# Patient Record
Sex: Female | Born: 2000 | Race: White | Hispanic: No | Marital: Single | Smoking: Never smoker
Health system: Southern US, Community
[De-identification: ages and names within clinical notes are randomized; demographics above are authoritative.]

---
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
## 2020-07-21 ENCOUNTER — Ambulatory Visit (INDEPENDENT_AMBULATORY_CARE_PROVIDER_SITE_OTHER): Payer: Managed Care, Other (non HMO) | Admitting: Family Medicine

## 2020-07-21 ENCOUNTER — Encounter: Payer: Self-pay | Admitting: Family Medicine

## 2020-07-21 ENCOUNTER — Other Ambulatory Visit: Payer: Self-pay

## 2020-07-21 VITALS — BP 107/67 | HR 67 | Temp 98.3°F | Ht 68.0 in | Wt 142.2 lb

## 2020-07-21 DIAGNOSIS — Z0000 Encounter for general adult medical examination without abnormal findings: Secondary | ICD-10-CM | POA: Diagnosis not present

## 2020-07-21 DIAGNOSIS — Z0001 Encounter for general adult medical examination with abnormal findings: Secondary | ICD-10-CM

## 2020-07-21 NOTE — Patient Instructions (Signed)
It was very nice to see you today!  You are due for your meningitis vaccines, HPV vaccine, and hepatitis A vaccine.  We can do this here or you can get this done at the health department or even at student health at your [place].  Please continue working on diet and exercise.  I will see back in year for your next physical.  Come back to see me sooner if needed.  Take care, Dr Jerline Pain  PLEASE NOTE:  If you had any lab tests please let us know if you have not heard back within a few days. You may see your results on mychart before we have a chance to review them but we will give you a call once they are reviewed by [place]. If we ordered any referrals today, please let us know if you have not heard from their office within the next week.   Please try these tips to maintain a healthy lifestyle:  Eat at least 3 REAL meals and 1-2 snacks per day.  Aim for no more than 5 hours between eating.  If you eat breakfast, please do so within one hour of getting up.   Each meal should contain half fruits/vegetables, one quarter protein, and one quarter carbs (no bigger than a computer mouse)  Cut down on sweet beverages. This includes juice, soda, and sweet tea.   Drink at least 1 glass of water with each meal and aim for at least 8 glasses per day  Exercise at least 150 minutes every week.    Preventive Care 36-55 Years Old, Female Preventive care refers to lifestyle choices and visits with your health care provider that can promote health and wellness. This includes: A yearly physical exam. This is also called an annual wellness visit. Regular dental and eye exams. Immunizations. Screening for certain conditions. Healthy lifestyle choices, such as: Eating a healthy diet. Getting regular exercise. Not using drugs or products that contain nicotine and tobacco. Limiting alcohol use. What can I expect for my preventive care visit? Physical exam Your health care provider may check your: Height  and weight. These may be used to calculate your BMI (body mass index). BMI is a measurement that tells if you are at a healthy weight. Heart rate and blood pressure. Body temperature. Skin for abnormal spots. Counseling Your health care provider may ask you questions about your: Past medical problems. Family's medical history. Alcohol, tobacco, and drug use. Emotional well-being. Home life and relationship well-being. Sexual activity. Diet, exercise, and sleep habits. Work and work Statistician. Access to firearms. Method of birth control. Menstrual cycle. Pregnancy history. What immunizations do I need?  Vaccines are usually given at various ages, according to a schedule. Your health care provider will recommend vaccines for you based on your age, medicalhistory, and lifestyle or other factors, such as travel or where you work. What tests do I need?  Blood tests Lipid and cholesterol levels. These may be checked every 5 years starting at age 54. Hepatitis C test. Hepatitis B test. Screening Diabetes screening. This is done by checking your blood sugar (glucose) after you have not eaten for a while (fasting). STD (sexually transmitted disease) testing, if you are at risk. BRCA-related cancer screening. This may be done if you have a family history of breast, ovarian, tubal, or peritoneal cancers. Pelvic exam and Pap test. This may be done every 3 years starting at age 35. Starting at age 70, this may be done every 5 years if you  have a Pap test in combination with an HPV test. Talk with your health care provider about your test results, treatment options,and if necessary, the need for more tests. Follow these instructions at home: Eating and drinking  Eat a healthy diet that includes fresh fruits and vegetables, whole grains, lean protein, and low-fat dairy products. Take vitamin and mineral supplements as recommended by your health care provider. Do not drink alcohol if: Your  health care provider tells you not to drink. You are pregnant, may be pregnant, or are planning to become pregnant. If you drink alcohol: Limit how much you have to 0-1 drink a day. Be aware of how much alcohol is in your drink. In the [place] one drink equals one 12 oz bottle of beer (355 mL), one 5 oz glass of wine (148 mL), or one 1 oz glass of hard liquor (44 mL).  Lifestyle Take daily care of your teeth and gums. Brush your teeth every morning and night with fluoride toothpaste. Floss one time each day. Stay active. Exercise for at least 30 minutes 5 or more days each week. Do not use any products that contain nicotine or tobacco, such as cigarettes, e-cigarettes, and chewing tobacco. If you need help quitting, ask your health care provider. Do not use drugs. If you are sexually active, practice safe sex. Use a condom or other form of protection to prevent STIs (sexually transmitted infections). If you do not wish to become pregnant, use a form of birth control. If you plan to become pregnant, see your health care provider for a prepregnancy visit. Find healthy ways to cope with stress, such as: Meditation, yoga, or listening to music. Journaling. Talking to a trusted person. Spending time with friends and family. Safety Always wear your seat belt while driving or riding in a vehicle. Do not drive: If you have been drinking alcohol. Do not ride with someone who has been drinking. When you are tired or distracted. While texting. Wear a helmet and other protective equipment during sports activities. If you have firearms in your house, make sure you follow all gun safety procedures. Seek help if you have been physically or sexually abused. What's next? Go to your health care provider once a year for an annual wellness visit. Ask your health care provider how often you should have your eyes and teeth checked. Stay up to date on all vaccines. This information is not intended to replace  advice given to you by your health care provider. Make sure you discuss any questions you have with your healthcare provider. Document Revised: 08/18/2019 Document Reviewed: 08/31/2017 Elsevier Patient Education  2022 [place] for Parents  HPV (human papillomavirus) is a common virus that spreads easily from person to person through skin-to-skin or sexual contact. There are many types of HPV viruses. They can cause warts in the genitals (genital or mucosal HPV), or on the hands or feet (cutaneous or nonmucosal HPV). Some genital HPV types are considered high-risk and may cause cancer. Your child can get a vaccination to help prevent certain HPV infections that can cause cancer as well as those types that cause genital and anal warts. The vaccine is safe and effective. It is recommended for boys and girls at about 34-10 years of age. Getting the vaccine at this age (before he or she is sexually active) gives your child the best protection from HPV infectionthrough adulthood. How can HPV affect my child? HPV infection can cause: Genital warts.  Mouth or throat cancer. Anal cancer. Cervical, vulvar, or vaginal cancer. Penile cancer. During pregnancy, HPV infection can be passed to the baby. This infection cancause warts to develop in a baby's throat and windpipe. What actions can I take to lower my child's risk for HPV? To lower your child's risk for genital HPV infection, have him or her get the HPV vaccine before becoming sexually active. The best time for vaccination is between ages 3 and 28, though it can be given to children as young as 65 years old. If your child gets the first dose before age 40, the vaccination can be given as 2 shots, 6-12 months apart. In some situations, 3 doses are needed. If your child starts the vaccine before age 39 but does not have a second dose within 6-12 months after the first dose, he or she will need 3 doses to complete the  vaccination. When your child has the first dose, it is important to make an appointment for the next shot and keep the appointment. Teens who are not vaccinated before age 46 will need 3 doses, within six months of the first dose. If your child has a weak immune system, he or she may need 3 doses. Young adults can also get the vaccination, even if they are already sexually active and even if they have already been infected with HPV. The vaccination can still help prevent the types of cancer-causing HPV that a person has notbeen infected with. What are the risks and benefits of the HPV vaccine? Benefits The main benefit of getting vaccinated is to prevent certain cancers, including: Cervical, vulvar, and vaginal cancer in females. Penile cancer in males. Oral and anal cancer in both males and females. The risk of these cancers is lower if your child gets vaccinated before he orshe becomes sexually active. The vaccine also prevents genital warts caused by HPV. Risks The risks, although low, include side effects or reactions to the vaccine. Very few reactions have been reported, but they can include: Soreness, redness, or swelling at the injection site. Dizziness or headache. Fever. Who should not get the HPV vaccine or should wait to get it? Some children should not get the HPV vaccine or should wait. Discuss the risks and benefits of the vaccine with your child's health care provider if your child: Has had a severe allergic reaction to other vaccinations. Is allergic to yeast. Has a fever. Has had a recent illness. Is pregnant or may be pregnant. Where to find more information Centers for Disease Control and Prevention: https://www.boyd-meyer.org/ American Academy of Pediatrics: healthychildren.org Summary HPV (human papillomavirus) is a common virus that spreads from person to person through skin-to-skin or sexual contact. It can spread during vaginal, anal, or oral sex. Your child can get a vaccination  to prevent HPV infection and cancer. It is best to get the vaccination before becoming sexually active. The HPV vaccine can protect your child from genital warts and certain types of cancer, including cancer of the cervix, throat, mouth, vulva, vagina, anus, and penis. The HPV vaccine is both safe and effective. The best time for boys and girls to get the vaccination is when they are between ages 30 and 44. This information is not intended to replace advice given to you by your health care provider. Make sure you discuss any questions you have with your healthcare provider. Document Revised: 08/27/2019 Document Reviewed: 08/06/2019 Elsevier Patient Education  2022 [employer].

## 2020-07-21 NOTE — Progress Notes (Signed)
Rachel Pennington is a 20 y.o. female who presents today for a annual exam. She is a new patient.   Assessment/Plan:  Healthy 20 year old woman doing well.  Preventative Healthcare Seeing gynecology.  Pap smear deferred today.  Discussed vaccines.  She is apparently due for HPV, hepatitis A, and both meningitis vaccines.  We will obtain records from her pediatrician to make sure they were up-to-date.    Subjective:  HPI:  Today, patient plans to travel to [place] within a year in Fall 2023 for school  - wants to study abroad due to the cost of tuition being around $200. She wants to make sure that her immunizations up-to-date for travel safety. 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 Prior patient of [facility] pediatrics but she has aged out.  Patient is aware that she has had one vaccination for hepatitis.She is up-to-date on her flu vaccinations. Her Meningitis vaccination is potentially overdue. It is planned to obtain records from [facility]. HPV vaccination was offered today- patient was hesitant at first due to fear of needles, but she does plan to have this done soon - she will be out of town by August 20, 2020 so she has to plan for it. TD was in 2013 and she will be due next year - she will plan to have this done as well.  It has been years since she has seen her gynecologist - they could have talked to her about a pap smear but she is not well aware. Patient is on birth control and she wants to continue this prescription.  Her face recently became inflamed - was not able to touch her face due to pain ad being sensitive. She relates this to potentially eating something that may have triggered this reaction. Patient did take medication for it and it is clearing up gradually.  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 Her diet is well  balanced.  ROS: Per HPI, otherwise a complete review of systems was negative.   PMH:  The following were reviewed and entered/updated in epic: History reviewed. No pertinent past medical history. There are no problems to display for this patient.  History reviewed. No pertinent surgical history.  History reviewed. No pertinent family history.  Medications- reviewed and updated Current Outpatient Medications  Medication Sig Dispense Refill   norgestimate-ethinyl estradiol (ORTHO-CYCLEN) 0.25-35 MG-MCG tablet Take 1 tablet by mouth daily.     No current facility-administered medications for this visit.    Allergies-reviewed and updated Allergies  Allergen Reactions   Penicillins Rash    Social History   Socioeconomic History   Marital status: Single    Spouse name: Not on file   Number of children: Not on file   Years of education: Not on file   Highest education level: Not on file  Occupational History   Not on file  Tobacco Use   Smoking status: Not on file   Smokeless tobacco: Not on file  Substance and Sexual Activity   Alcohol use: Not on file   Drug use: Not on file   Sexual activity: Not on file  Other Topics Concern   Not on file  Social History Narrative   Not on file   Social Determinants of Health   Financial Resource Strain: Not on file  Food Insecurity: Not on file  Transportation Needs: Not on file  Physical  Activity: Not on file  Stress: Not on file  Social Connections: Not on file         Objective:  Physical Exam: BP 107/67   Pulse 67   Temp 98.3 F (36.8 C) (Temporal)   Ht 5\' 8"  (1.727 m)   Wt 142 lb 3.2 oz (64.5 kg)   LMP 07/09/2020   SpO2 98%   BMI 21.62 kg/m   Gen: No acute distress, resting comfortably CV: Regular rate and rhythm with no murmurs appreciated Pulm: Normal work of breathing, clear to auscultation bilaterally with no crackles, wheezes, or rhonchi Neuro: Grossly normal, moves all extremities Psych: Normal  affect and thought content      I,Harris Phan,acting as a scribe for 09/09/2020, MD.,have documented all relevant documentation on the behalf of Jacquiline Doe, MD,as directed by  Jacquiline Doe, MD while in the presence of Jacquiline Doe, MD.  I, Jacquiline Doe, MD, have reviewed all documentation for this visit. The documentation on 07/21/20 for the exam, diagnosis, procedures, and orders are all accurate and complete.  07/23/20. Katina Degree, MD 07/21/2020 3:18 PM

## 2021-05-11 ENCOUNTER — Other Ambulatory Visit: Payer: Self-pay | Admitting: Family Medicine

## 2021-05-11 DIAGNOSIS — G43809 Other migraine, not intractable, without status migrainosus: Secondary | ICD-10-CM

## 2022-01-18 ENCOUNTER — Ambulatory Visit (INDEPENDENT_AMBULATORY_CARE_PROVIDER_SITE_OTHER): Payer: Managed Care, Other (non HMO) | Admitting: Family Medicine

## 2022-01-18 ENCOUNTER — Encounter: Payer: Self-pay | Admitting: Family Medicine

## 2022-01-18 VITALS — BP 90/63 | HR 79 | Temp 97.8°F | Ht 68.0 in | Wt 144.6 lb

## 2022-01-18 DIAGNOSIS — Z114 Encounter for screening for human immunodeficiency virus [HIV]: Secondary | ICD-10-CM | POA: Diagnosis not present

## 2022-01-18 DIAGNOSIS — Z880 Allergy status to penicillin: Secondary | ICD-10-CM | POA: Diagnosis not present

## 2022-01-18 DIAGNOSIS — Z0001 Encounter for general adult medical examination with abnormal findings: Secondary | ICD-10-CM | POA: Diagnosis not present

## 2022-01-18 DIAGNOSIS — Z1159 Encounter for screening for other viral diseases: Secondary | ICD-10-CM | POA: Diagnosis not present

## 2022-01-18 DIAGNOSIS — Z23 Encounter for immunization: Secondary | ICD-10-CM | POA: Diagnosis not present

## 2022-01-18 DIAGNOSIS — Z13220 Encounter for screening for lipoid disorders: Secondary | ICD-10-CM | POA: Diagnosis not present

## 2022-01-18 LAB — COMPREHENSIVE METABOLIC PANEL
ALT: 20 U/L (ref 0–35)
AST: 18 U/L (ref 0–37)
Albumin: 4.5 g/dL (ref 3.5–5.2)
Alkaline Phosphatase: 57 U/L (ref 39–117)
BUN: 14 mg/dL (ref 6–23)
CO2: 28 mEq/L (ref 19–32)
Calcium: 9.5 mg/dL (ref 8.4–10.5)
Chloride: 103 mEq/L (ref 96–112)
Creatinine, Ser: 0.65 mg/dL (ref 0.40–1.20)
GFR: 124.44 mL/min (ref >60.00)
Glucose, Bld: 94 mg/dL (ref 70–99)
Potassium: 3.7 mEq/L (ref 3.5–5.1)
Sodium: 138 mEq/L (ref 135–145)
Total Bilirubin: 0.3 mg/dL (ref 0.2–1.2)
Total Protein: 7.1 g/dL (ref 6.0–8.3)

## 2022-01-18 LAB — TSH: TSH: 1.64 u[IU]/mL (ref 0.35–5.50)

## 2022-01-18 LAB — LIPID PANEL
Cholesterol: 194 mg/dL (ref 0–200)
HDL: 58.7 mg/dL (ref >39.00)
LDL Cholesterol: 108 mg/dL — ABNORMAL HIGH (ref 0–99)
NonHDL: 135.26
Total CHOL/HDL Ratio: 3
Triglycerides: 138 mg/dL (ref 0.0–149.0)
VLDL: 27.6 mg/dL (ref 0.0–40.0)

## 2022-01-18 LAB — CBC
HCT: 39.9 % (ref 36.0–46.0)
Hemoglobin: 13.6 g/dL (ref 12.0–15.0)
MCHC: 34 g/dL (ref 30.0–36.0)
MCV: 86.8 fl (ref 78.0–100.0)
Platelets: 201 10*3/uL (ref 150.0–400.0)
RBC: 4.6 Mil/uL (ref 3.87–5.11)
RDW: 13.6 % (ref 11.5–15.5)
WBC: 5 10*3/uL (ref 4.0–10.5)

## 2022-01-18 NOTE — Patient Instructions (Signed)
It was very nice to see you today!  Will check labs today.  I will refer you for allergy testing.  Will get your second HPV vaccine and first meningitis B vaccine.  We will see you back in a year for your next physical.  Come back sooner if needed.  Take care, Dr Jerline Pain  PLEASE NOTE:  If you had any lab tests, please let us know if you have not heard back within a few days. You may see your results on mychart before we have a chance to review them but we will give you a call once they are reviewed by [place].   If we ordered any referrals today, please let us know if you have not heard from their office within the next week.   If you had any urgent prescriptions sent in today, please check with the pharmacy within an hour of our visit to make sure the prescription was transmitted appropriately.   Please try these tips to maintain a healthy lifestyle:  Eat at least 3 REAL meals and 1-2 snacks per day.  Aim for no more than 5 hours between eating.  If you eat breakfast, please do so within one hour of getting up.   Each meal should contain half fruits/vegetables, one quarter protein, and one quarter carbs (no bigger than a computer mouse)  Cut down on sweet beverages. This includes juice, soda, and sweet tea.   Drink at least 1 glass of water with each meal and aim for at least 8 glasses per day  Exercise at least 150 minutes every week.    Preventive Care 86-71 Years Old, Female Preventive care refers to lifestyle choices and visits with your health care provider that can promote health and wellness. Preventive care visits are also called wellness exams. What can I expect for my preventive care visit? Counseling During your preventive care visit, your health care provider may ask about your: Medical history, including: Past medical problems. Family medical history. Pregnancy history. Current health, including: Menstrual cycle. Method of birth control. Emotional  well-being. Home life and relationship well-being. Sexual activity and sexual health. Lifestyle, including: Alcohol, nicotine or tobacco, and drug use. Access to firearms. Diet, exercise, and sleep habits. Work and work Statistician. Sunscreen use. Safety issues such as seatbelt and bike helmet use. Physical exam Your health care provider may check your: Height and weight. These may be used to calculate your BMI (body mass index). BMI is a measurement that tells if you are at a healthy weight. Waist circumference. This measures the distance around your waistline. This measurement also tells if you are at a healthy weight and may help predict your risk of certain diseases, such as type 2 diabetes and high blood pressure. Heart rate and blood pressure. Body temperature. Skin for abnormal spots. What immunizations do I need?  Vaccines are usually given at various ages, according to a schedule. Your health care provider will recommend vaccines for you based on your age, medical history, and lifestyle or other factors, such as travel or where you work. What tests do I need? Screening Your health care provider may recommend screening tests for certain conditions. This may include: Pelvic exam and Pap test. Lipid and cholesterol levels. Diabetes screening. This is done by checking your blood sugar (glucose) after you have not eaten for a while (fasting). Hepatitis B test. Hepatitis C test. HIV (human immunodeficiency virus) test. STI (sexually transmitted infection) testing, if you are at risk. BRCA-related cancer screening. This  may be done if you have a family history of breast, ovarian, tubal, or peritoneal cancers. Talk with your health care provider about your test results, treatment options, and if necessary, the need for more tests. Follow these instructions at home: Eating and drinking  Eat a healthy diet that includes fresh fruits and vegetables, whole grains, lean protein, and  low-fat dairy products. Take vitamin and mineral supplements as recommended by your health care provider. Do not drink alcohol if: Your health care provider tells you not to drink. You are pregnant, may be pregnant, or are planning to become pregnant. If you drink alcohol: Limit how much you have to 0-1 drink a day. Know how much alcohol is in your drink. In the [place] one drink equals one 12 oz bottle of beer (355 mL), one 5 oz glass of wine (148 mL), or one 1 oz glass of hard liquor (44 mL). Lifestyle Brush your teeth every morning and night with fluoride toothpaste. Floss one time each day. Exercise for at least 30 minutes 5 or more days each week. Do not use any products that contain nicotine or tobacco. These products include cigarettes, chewing tobacco, and vaping devices, such as e-cigarettes. If you need help quitting, ask your health care provider. Do not use drugs. If you are sexually active, practice safe sex. Use a condom or other form of protection to prevent STIs. If you do not wish to become pregnant, use a form of birth control. If you plan to become pregnant, see your health care provider for a prepregnancy visit. Find healthy ways to manage stress, such as: Meditation, yoga, or listening to music. Journaling. Talking to a trusted person. Spending time with friends and family. Minimize exposure to UV radiation to reduce your risk of skin cancer. Safety Always wear your seat belt while driving or riding in a vehicle. Do not drive: If you have been drinking alcohol. Do not ride with someone who has been drinking. If you have been using any mind-altering substances or drugs. While texting. When you are tired or distracted. Wear a helmet and other protective equipment during sports activities. If you have firearms in your house, make sure you follow all gun safety procedures. Seek help if you have been physically or sexually abused. What's next? Go to your health care  provider once a year for an annual wellness visit. Ask your health care provider how often you should have your eyes and teeth checked. Stay up to date on all vaccines. This information is not intended to replace advice given to you by your health care provider. Make sure you discuss any questions you have with your health care provider. Document Revised: 06/17/2020 Document Reviewed: 06/17/2020 Elsevier Patient Education  [place].

## 2022-01-18 NOTE — Progress Notes (Signed)
Chief Complaint:  Rachel Pennington is a 23 y.o. female who presents today for her annual comprehensive physical exam.    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 Will place referral today.   Preventative Healthcare: HpV and MenB given today. Check labs. Follows with GYN for women's health.   Patient Counseling(The following topics were reviewed and/or handout was given):  -Nutrition: Stressed importance of moderation in sodium/caffeine intake, saturated fat and cholesterol, caloric balance, sufficient intake of fresh fruits, vegetables, and fiber.  -Stressed the importance of regular exercise.   -Substance Abuse: Discussed cessation/primary prevention of tobacco, alcohol, or other drug use; driving or other dangerous activities under the influence; availability of treatment for abuse.   -Injury prevention: Discussed safety belts, safety helmets, smoke detector, smoking near bedding or upholstery.   -Sexuality: Discussed sexually transmitted diseases, partner selection, use of condoms, avoidance of unintended pregnancy and contraceptive alternatives.   -Dental health: Discussed importance of regular tooth brushing, flossing, and dental visits.  -Health maintenance and immunizations reviewed. Please refer to Health maintenance section.  Return to care in 1 year for next preventative visit.     Subjective:  HPI:  She has no acute complaints today.   Lifestyle Diet: Balanced. Plenty of fruits and vegetables.  Exercise: None specific. Does a lot of walking.      01/18/2022    1:07 PM  Depression screen PHQ 2/9  Decreased Interest 0  Down, Depressed, Hopeless 0  PHQ - 2 Score 0    Health Maintenance Due  Topic Date Due   Hepatitis C Screening  Never done   PAP-Cervical Cytology Screening  Never done   PAP SMEAR-Modifier  Never done   HPV VACCINES (2 - 3-dose series) 05/03/2021     ROS: Per HPI, otherwise a complete review of systems  was negative.   PMH:  The following were reviewed and entered/updated in epic: History reviewed. No pertinent past medical history. There are no problems to display for this patient.  History reviewed. No pertinent surgical history.  History reviewed. No pertinent family history.  Medications- reviewed and updated Current Outpatient Medications  Medication Sig Dispense Refill   norgestimate-ethinyl estradiol (ORTHO-CYCLEN) 0.25-35 MG-MCG tablet Take 1 tablet by mouth daily.     No current facility-administered medications for this visit.    Allergies-reviewed and updated Allergies  Allergen Reactions   Penicillins Rash    Social History   Socioeconomic History   Marital status: Single    Spouse name: Not on file   Number of children: Not on file   Years of education: Not on file   Highest education level: Not on file  Occupational History   Not on file  Tobacco Use   Smoking status: Not on file   Smokeless tobacco: Not on file  Substance and Sexual Activity   Alcohol use: Not on file   Drug use: Not on file   Sexual activity: Not on file  Other Topics Concern   Not on file  Social History Narrative   Not on file   Social Determinants of Health   Financial Resource Strain: Not on file  Food Insecurity: Not on file  Transportation Needs: Not on file  Physical Activity: Not on file  Stress: Not on file  Social Connections: Not on file        Objective:  Physical Exam: BP 90/63   Pulse 79   Temp 97.8 F (36.6 C) (Temporal)   Ht 5\' 8"  (1.727 m)  Wt 144 lb 9.6 oz (65.6 kg)   LMP 12/28/2021   SpO2 96%   BMI 21.99 kg/m   Body mass index is 21.99 kg/m. Wt Readings from Last 3 Encounters:  01/18/22 144 lb 9.6 oz (65.6 kg)  07/21/20 142 lb 3.2 oz (64.5 kg)   Gen: NAD, resting comfortably=HEENT: TMs normal bilaterally. OP clear. No thyromegaly noted.  CV: RRR with no murmurs appreciated Pulm: NWOB, CTAB with no crackles, wheezes, or rhonchi GI:  Normal bowel sounds present. Soft, Nontender, Nondistended. MSK: no edema, cyanosis, or clubbing noted Skin: warm, dry Neuro: CN2-12 grossly intact. Strength 5/5 in upper and lower extremities. Reflexes symmetric and intact bilaterally.  Psych: Normal affect and thought content     Rachel Pennington M. Jerline Pain, MD 01/18/2022 1:36 PM

## 2022-01-18 NOTE — Addendum Note (Signed)
Addended by: Betti Cruz on: 01/18/2022 03:03 PM   Modules accepted: Orders

## 2022-01-19 LAB — HIV ANTIBODY (ROUTINE TESTING W REFLEX): HIV 1&2 Ab, 4th Generation: NONREACTIVE

## 2022-01-19 LAB — HEPATITIS C ANTIBODY: Hepatitis C Ab: NONREACTIVE

## 2022-01-20 NOTE — Progress Notes (Signed)
Please inform patient of the following:  Her "bad" cholesterol is a little bit elevated but everything else is stable.  She should continue to work on diet and exercise and we can recheck in a few years.  Do not need to make any other changes to treatment plan at this time.

## 2022-02-25 ENCOUNTER — Other Ambulatory Visit: Payer: Self-pay | Admitting: *Deleted

## 2022-05-24 ENCOUNTER — Ambulatory Visit (INDEPENDENT_AMBULATORY_CARE_PROVIDER_SITE_OTHER): Payer: Managed Care, Other (non HMO)

## 2022-05-24 DIAGNOSIS — Z23 Encounter for immunization: Secondary | ICD-10-CM | POA: Diagnosis not present

## 2022-05-24 NOTE — Progress Notes (Signed)
Pt is here for HPV and Bexsero vaccines for Dr.Parker. HPV given in right deltoid and Bexsero given in left deltoid. Pt tolerated well.

## 2022-06-07 ENCOUNTER — Encounter: Payer: Self-pay | Admitting: Family Medicine

## 2022-06-07 ENCOUNTER — Ambulatory Visit (INDEPENDENT_AMBULATORY_CARE_PROVIDER_SITE_OTHER): Payer: Managed Care, Other (non HMO) | Admitting: Family Medicine

## 2022-06-07 VITALS — BP 114/56 | HR 77 | Temp 97.5°F | Ht 68.0 in | Wt 150.0 lb

## 2022-06-07 DIAGNOSIS — G43809 Other migraine, not intractable, without status migrainosus: Secondary | ICD-10-CM

## 2022-06-07 DIAGNOSIS — G43909 Migraine, unspecified, not intractable, without status migrainosus: Secondary | ICD-10-CM | POA: Insufficient documentation

## 2022-06-07 MED ORDER — RIZATRIPTAN BENZOATE 10 MG PO TABS: 10.0000 mg | ORAL_TABLET | ORAL | 3 refills | Status: AC | PRN

## 2022-06-07 NOTE — Assessment & Plan Note (Addendum)
No red flags.  Reassuring exam.  Her migraines have increased slightly in frequency and severity over the last year or 2.  This may be natural progression of her illness however patient and her mother are worried about other possible underlying etiologies.  Will check MRI to further evaluate independently rule out any other secondary causes.  We will refill Maxalt 10 mg daily as needed.  She has been tolerating this well.  Advised her to use this and other analgesics only once or twice per week as needed to prevent rebound headaches.  We also did discuss preventative medications.  She would like to hold off on this for now and she will let us know if her migraines become more frequent or severe and we will readdress.  She does not want to try any injections.  Will prefer oral medication if possible.

## 2022-06-07 NOTE — Progress Notes (Signed)
   Rachel Pennington is a 22 y.o. female who presents today for an office visit.  Assessment/Plan:  Chronic Problems Addressed Today: Migraine No red flags.  Reassuring exam.  Her migraines have increased slightly in frequency and severity over the last year or 2.  This may be natural progression of her illness however patient and her mother are worried about other possible underlying etiologies.  Will check MRI to further evaluate independently rule out any other secondary causes.  We will refill Maxalt 10 mg daily as needed.  She has been tolerating this well.  Advised her to use this and other analgesics only once or twice per week as needed to prevent rebound headaches.  We also did discuss preventative medications.  She would like to hold off on this for now and she will let us know if her migraines become more frequent or severe and we will readdress.  She does not want to try any injections.  Will prefer oral medication if possible.     Subjective:  HPI:  Patient here with headaches and migraines. This has been going since she was a child. She is getting headaches about 1-2 times per week. Lasts for hours until she takes medication. Sometimes goes away after going to sleep. She is normally taking tylenol or motrin which has previously helped but this has seemed to lose effectiveness.  She has been diagnosed with migraines in the past by previous physicians.  In the past has been prescribed Maxalt which does work well.  Last prescription was a year or 2 ago.  She does notice several triggers including strong emotions, dehydration, sleep deprivation, strong smells, loud noises, and lites.  She does try to avoid triggers.  No reported weakness or numbness.       Objective:  Physical Exam: BP (!) 114/56   Pulse 77   Temp (!) 97.5 F (36.4 C) (Temporal)   Ht 5\' 8"  (1.727 m)   Wt 150 lb (68 kg)   LMP 05/04/2022 (Exact Date)   SpO2 96%   BMI 22.81 kg/m   Gen: No acute distress,  resting comfortably Neuro: Cranial nerves II through XII intact.  Moves all extremities spontaneously.  No focal deficits. Psych: Normal affect and thought content      Vinisha Faxon M. Jimmey Ralph, MD 06/07/2022 2:31 PM

## 2022-06-07 NOTE — Patient Instructions (Signed)
It was very nice to see you today!  We will check an MRI.  I will refill the rizatriptan.  Please let us know if you have worsening or more frequent migraines and we can discuss starting a preventative medication.  Return if symptoms worsen or fail to improve.   Take care, Dr Jimmey Ralph  PLEASE NOTE:  If you had any lab tests, please let us know if you have not heard back within a few days. You may see your results on mychart before we have a chance to review them but we will give you a call once they are reviewed by [place].   If we ordered any referrals today, please let us know if you have not heard from their office within the next week.   If you had any urgent prescriptions sent in today, please check with the pharmacy within an hour of our visit to make sure the prescription was transmitted appropriately.   Please try these tips to maintain a healthy lifestyle:  Eat at least 3 REAL meals and 1-2 snacks per day.  Aim for no more than 5 hours between eating.  If you eat breakfast, please do so within one hour of getting up.   Each meal should contain half fruits/vegetables, one quarter protein, and one quarter carbs (no bigger than a computer mouse)  Cut down on sweet beverages. This includes juice, soda, and sweet tea.   Drink at least 1 glass of water with each meal and aim for at least 8 glasses per day  Exercise at least 150 minutes every week.

## 2022-06-10 ENCOUNTER — Encounter: Payer: Self-pay | Admitting: Family Medicine

## 2022-06-17 ENCOUNTER — Ambulatory Visit
Admission: RE | Admit: 2022-06-17 | Discharge: 2022-06-17 | Disposition: A | Discharge status: Home / Self Care | Payer: Managed Care, Other (non HMO) | Source: Ambulatory Visit | Attending: Family Medicine | Admitting: Family Medicine

## 2022-06-17 DIAGNOSIS — G43809 Other migraine, not intractable, without status migrainosus: Secondary | ICD-10-CM

## 2022-06-19 ENCOUNTER — Other Ambulatory Visit: Payer: Managed Care, Other (non HMO)

## 2022-06-22 ENCOUNTER — Telehealth: Payer: Self-pay | Admitting: Family Medicine

## 2022-06-22 ENCOUNTER — Other Ambulatory Visit: Payer: Self-pay | Admitting: *Deleted

## 2022-06-22 DIAGNOSIS — G43809 Other migraine, not intractable, without status migrainosus: Secondary | ICD-10-CM

## 2022-06-22 NOTE — Telephone Encounter (Signed)
French Ana with [facility] requests to be called re: Patient's MRI Head results

## 2022-06-22 NOTE — Telephone Encounter (Signed)
See result note.  

## 2022-06-22 NOTE — Progress Notes (Signed)
Her MRI showed a few non specific abnormalities.  It is possible that the findings could be caused by her migraines however I recommend we have her see a neurologist for further evaluation.  Please place referral.

## 2022-06-30 ENCOUNTER — Encounter: Payer: Self-pay | Admitting: Neurology

## 2022-07-04 NOTE — Progress Notes (Unsigned)
New Patient Note  RE: Rachel Pennington MRN: [id number] DOB: [birth year] Date of Office Visit: 07/05/2022  Consult requested by: Ardith Dark, MD Primary care provider: Ardith Dark, MD  Chief Complaint: No chief complaint on file.  History of Present Illness: I had the pleasure of seeing Rachel Pennington for initial evaluation at the [facility] on 07/04/2022. She is a 22 y.o. female, who is referred here by Ardith Dark, MD for the evaluation of ***.  ***  Assessment and Plan: Rachel Pennington is a 22 y.o. female with: No problem-specific Assessment & Plan notes found for this encounter.  No follow-ups on file.  No orders of the defined types were placed in this encounter.  Lab Orders  No laboratory test(s) ordered today    Other allergy screening: Asthma: {Blank single:19197::"yes","no"} Rhino conjunctivitis: {Blank single:19197::"yes","no"} Food allergy: {Blank single:19197::"yes","no"} Medication allergy: {Blank single:19197::"yes","no"} Hymenoptera allergy: {Blank single:19197::"yes","no"} Urticaria: {Blank single:19197::"yes","no"} Eczema:{Blank single:19197::"yes","no"} History of recurrent infections suggestive of immunodeficency: {Blank single:19197::"yes","no"}  Diagnostics: Spirometry:  Tracings reviewed. Her effort: {Blank single:19197::"Good reproducible efforts.","It was hard to get consistent efforts and there is a question as to whether this reflects a maximal maneuver.","Poor effort, data can not be interpreted."} FVC: ***L FEV1: ***L, ***% predicted FEV1/FVC ratio: ***% Interpretation: {Blank single:19197::"Spirometry consistent with mild obstructive disease","Spirometry consistent with moderate obstructive disease","Spirometry consistent with severe obstructive disease","Spirometry consistent with possible restrictive disease","Spirometry consistent with mixed obstructive and restrictive disease","Spirometry uninterpretable due  to technique","Spirometry consistent with normal pattern","No overt abnormalities noted given today's efforts"}.  Please see scanned spirometry results for details.  Skin Testing: {Blank single:19197::"Select foods","Environmental allergy panel","Environmental allergy panel and select foods","Food allergy panel","None","Deferred due to recent antihistamines use"}. *** Results discussed with patient/family.   Past Medical History: Patient Active Problem List   Diagnosis Date Noted  . Migraine 06/07/2022   No past medical history on file. Past Surgical History: No past surgical history on file. Medication List:  Current Outpatient Medications  Medication Sig Dispense Refill  . norgestimate-ethinyl estradiol (ORTHO-CYCLEN) 0.25-35 MG-MCG tablet Take 1 tablet by mouth daily.    . rizatriptan (MAXALT) 10 MG tablet Take 1 tablet (10 mg total) by mouth as needed for migraine. May repeat in 2 hours if needed 30 tablet 3   No current facility-administered medications for this visit.   Allergies: Allergies  Allergen Reactions  . Penicillins Rash   Social History: Social History   Socioeconomic History  . Marital status: Single    Spouse name: Not on file  . Number of children: Not on file  . Years of education: Not on file  . Highest education level: Not on file  Occupational History  . Not on file  Tobacco Use  . Smoking status: Never    Passive exposure: Never  . Smokeless tobacco: Never  Substance and Sexual Activity  . Alcohol use: Yes    Comment: occ  . Drug use: Never  . Sexual activity: Not on file  Other Topics Concern  . Not on file  Social History Narrative  . Not on file   Social Determinants of Health   Financial Resource Strain: Not on file  Food Insecurity: Not on file  Transportation Needs: Not on file  Physical Activity: Not on file  Stress: Not on file  Social Connections: Not on file   Lives in a ***. Smoking: *** Occupation:  ***  Environmental History: Water Damage/mildew in the house: Copywriter, advertising in the family room: {Blank single:19197::"yes","no"} [employer]  in the bedroom: {Blank single:19197::"yes","no"} Heating: {Blank single:19197::"electric","gas","heat pump"} Cooling: {Blank single:19197::"central","window","heat pump"} Pet: {Blank single:19197::"yes ***","no"}  Family History: No family history on file. Problem                               Relation Asthma                                   *** Eczema                                *** Food allergy                          *** Allergic rhino conjunctivitis     ***  Review of Systems  Constitutional:  Negative for appetite change, chills, fever and unexpected weight change.  HENT:  Negative for congestion and rhinorrhea.   Eyes:  Negative for itching.  Respiratory:  Negative for cough, chest tightness, shortness of breath and wheezing.   Cardiovascular:  Negative for chest pain.  Gastrointestinal:  Negative for abdominal pain.  Genitourinary:  Negative for difficulty urinating.  Skin:  Negative for rash.  Neurological:  Negative for headaches.   Objective: There were no vitals taken for this visit. There is no height or weight on file to calculate BMI. Physical Exam Vitals and nursing note reviewed.  Constitutional:      Appearance: Normal appearance. She is well-developed.  HENT:     Head: Normocephalic and atraumatic.     Right Ear: Tympanic membrane and external ear normal.     Left Ear: Tympanic membrane and external ear normal.     Nose: Nose normal.     Mouth/Throat:     Mouth: Mucous membranes are moist.     Pharynx: Oropharynx is clear.  Eyes:     Conjunctiva/sclera: Conjunctivae normal.  Cardiovascular:     Rate and Rhythm: Normal rate and regular rhythm.     Heart sounds: Normal heart sounds. No murmur heard.    No friction rub. No gallop.  Pulmonary:     Effort: Pulmonary effort is normal.      Breath sounds: Normal breath sounds. No wheezing, rhonchi or rales.  Musculoskeletal:     Cervical back: Neck supple.  Skin:    General: Skin is warm.     Findings: No rash.  Neurological:     Mental Status: She is alert and oriented to person, place, and time.  Psychiatric:        Behavior: Behavior normal.  The plan was reviewed with the patient/family, and all questions/concerned were addressed.  It was my pleasure to see Rachel Pennington today and participate in her care. Please feel free to contact me with any questions or concerns.  Sincerely,  Wyline Mood, DO Allergy & Immunology  [facility] office: [phone] [facility] office: [phone]

## 2022-07-05 ENCOUNTER — Ambulatory Visit (INDEPENDENT_AMBULATORY_CARE_PROVIDER_SITE_OTHER): Payer: Managed Care, Other (non HMO) | Admitting: Allergy

## 2022-07-05 ENCOUNTER — Encounter: Payer: Self-pay | Admitting: Allergy

## 2022-07-05 VITALS — BP 110/80 | HR 73 | Temp 98.5°F | Resp 16 | Ht 68.0 in | Wt 150.5 lb

## 2022-07-05 DIAGNOSIS — T781XXD Other adverse food reactions, not elsewhere classified, subsequent encounter: Secondary | ICD-10-CM

## 2022-07-05 DIAGNOSIS — Z880 Allergy status to penicillin: Secondary | ICD-10-CM | POA: Diagnosis not present

## 2022-07-05 DIAGNOSIS — T781XXA Other adverse food reactions, not elsewhere classified, initial encounter: Secondary | ICD-10-CM | POA: Diagnosis not present

## 2022-07-05 DIAGNOSIS — R21 Rash and other nonspecific skin eruption: Secondary | ICD-10-CM

## 2022-07-05 NOTE — Patient Instructions (Addendum)
Today's skin testing was negative to avocado.  Foods Discussed with patient that skin prick testing and bloodwork (food IgE levels) check for IgE mediated reactions which her clinical presentation does not support.  Continue to avoid avocado - you most likely have some intolerance to it.   Penicillin allergy: Consider penicillin allergy skin testing and in office drug challenge in the future.  Over 90% of people with history of penicillin allergy which occurred over 10 years ago are found to be non-allergic.  You must be off antihistamines for 3-5 days before. Plan on being in the office for 2-3 hours. You must call to schedule an appointment and specify it's for a drug challenge.  A few days prior to the appointment, I will send in a prescription for amoxicillin liquid which you must bring to the appointment as well.   Skin See below for proper skin care. Follow up with dermatologist as scheduled.  Follow up for penicillin drug testing and challenge.   Skin care recommendations  Bath time: Always use lukewarm water. AVOID very hot or cold water. Keep bathing time to 5-10 minutes. Do NOT use bubble bath. Use a mild soap and use just enough to wash the dirty areas. Do NOT scrub skin vigorously.  After bathing, pat dry your skin with a towel. Do NOT rub or scrub the skin.  Moisturizers and prescriptions:  ALWAYS apply moisturizers immediately after bathing (within 3 minutes). This helps to lock-in moisture. Use the moisturizer several times a day over the whole body. Good summer moisturizers include: Aveeno, CeraVe, Cetaphil. Good winter moisturizers include: Aquaphor, Vaseline, Cerave, Cetaphil, Eucerin, Vanicream. When using moisturizers along with medications, the moisturizer should be applied about one hour after applying the medication to prevent diluting effect of the medication or moisturize around where you applied the medications. When not using medications, the moisturizer  can be continued twice daily as maintenance.  Laundry and clothing: Avoid laundry products with added color or perfumes. Use unscented hypo-allergenic laundry products such as Tide free, Cheer free & gentle, and All free and clear.  If the skin still seems dry or sensitive, you can try double-rinsing the clothes. Avoid tight or scratchy clothing such as wool. Do not use fabric softeners or dyer sheets.

## 2022-07-06 ENCOUNTER — Encounter: Payer: Self-pay | Admitting: Allergy

## 2022-07-06 DIAGNOSIS — Z713 Dietary counseling and surveillance: Secondary | ICD-10-CM | POA: Insufficient documentation

## 2022-07-06 DIAGNOSIS — Z880 Allergy status to penicillin: Secondary | ICD-10-CM | POA: Insufficient documentation

## 2022-07-06 DIAGNOSIS — T781XXD Other adverse food reactions, not elsewhere classified, subsequent encounter: Secondary | ICD-10-CM | POA: Insufficient documentation

## 2022-07-06 DIAGNOSIS — R21 Rash and other nonspecific skin eruption: Secondary | ICD-10-CM | POA: Insufficient documentation

## 2022-07-06 NOTE — Assessment & Plan Note (Signed)
See below for proper skin care. Follow up with dermatologist as scheduled.

## 2022-07-06 NOTE — Assessment & Plan Note (Signed)
Rash and itching as a child. Unsure of treatment. Tolerates other antibiotics with no issues. Consider penicillin allergy skin testing and in office drug challenge in the future.  Over 90% of people with history of penicillin allergy which occurred over 10 years ago are found to be non-allergic.

## 2022-07-06 NOTE — Assessment & Plan Note (Signed)
Abdominal pain with avocado ingestion that resolves after a few hours without intervention. No other symptoms. No issues with latex, kiwi or bananas. Tolerates a varied food otherwise. Today's skin testing was negative to avocado. Discussed with patient that skin prick testing and bloodwork (food IgE levels) check for IgE mediated reactions which her clinical presentation does not support.  Continue to avoid avocado - you most likely have some intolerance to it.

## 2022-07-27 ENCOUNTER — Telehealth: Payer: Self-pay | Admitting: Allergy

## 2022-07-27 MED ORDER — AMOXICILLIN 250 MG/5ML PO SUSR: ORAL | 0 refills | Status: DC

## 2022-07-27 NOTE — Telephone Encounter (Signed)
Please call patient and let her know that amoxicillin Rx was sent in.  Do NOT take at home and bring to next week's drug challenge.  Keep medication in fridge.  You must be off antihistamines for 3-5 days before. Must be in good health and not ill. No vaccines/injections/antibiotics within the past 7 days. Plan on being in the office for 2-3 hours and must bring in the drug you want to do the oral challenge for.

## 2022-07-27 NOTE — Telephone Encounter (Signed)
Spoke with patient, informed her of Dr. Elmyra Ricks note below. Patient verbalized understanding.

## 2022-08-03 NOTE — Progress Notes (Deleted)
Follow Up Note  RE: Allice Shiu MRN: [id number] DOB: 02/21/2000 Date of Office Visit: 08/04/2022  Referring provider: Ardith Dark, MD Primary care provider: Ardith Dark, MD  Chief Complaint: No chief complaint on file.  Assessment and Plan: Marlette is a 22 y.o. female with: No problem-specific Assessment & Plan notes found for this encounter.  No follow-ups on file.  Plan: Challenge drug: *** Challenge as per protocol: {Blank single:19197::"Passed","Failed"} Total time: ***  For next 24 hours monitor for hives, swelling, shortness of breath and dizziness. If you see these symptoms, use Benadryl for mild symptoms and for more severe symptoms go to ER/urgent care or call 911.  If no adverse symptoms in the next 24 hours then will take off drug from allergy list.  History of Present Illness: I had the pleasure of seeing Alescia Rowbotham for a follow up visit at the [facility] on 08/03/2022. She is a 22 y.o. female, who is being followed for adverse food reaction, penicillin allergy and rash. Her previous allergy office visit was on 07/05/2022 with Dr. Selena Batten. Today she is here for penicillin drug testing and challenge.   History of Reaction: Other adverse food reactions, not elsewhere classified, subsequent encounter Abdominal pain with avocado ingestion that resolves after a few hours without intervention. No other symptoms. No issues with latex, kiwi or bananas. Tolerates a varied food otherwise. Today's skin testing was negative to avocado. Discussed with patient that skin prick testing and bloodwork (food IgE levels) check for IgE mediated reactions which her clinical presentation does not support.  Continue to avoid avocado - you most likely have some intolerance to it.    Penicillin allergy Rash and itching as a child. Unsure of treatment. Tolerates other antibiotics with no issues. Consider penicillin allergy skin testing and in office  drug challenge in the future.  Over 90% of people with history of penicillin allergy which occurred over 10 years ago are found to be non-allergic.    Rash and other nonspecific skin eruption See below for proper skin care. Follow up with dermatologist as scheduled.  Interval History: Patient has not been ill, she has not had any accidental exposures to the culprit medication.   Recent/Current History: Pulmonary disease: {Blank single:19197::"yes","no"} Cardiac disease: {Blank single:19197::"yes","no"} Respiratory infection: {Blank single:19197::"yes","no"} Rash: {Blank single:19197::"yes","no"} Itch: {Blank single:19197::"yes","no"} Swelling: {Blank single:19197::"yes","no"} Cough: {Blank single:19197::"yes","no"} Shortness of breath: {Blank single:19197::"yes","no"} Runny/stuffy nose: {Blank single:19197::"yes","no"} Itchy eyes: {Blank single:19197::"yes","no"} Beta-blocker use: {Blank single:19197::"yes","no"}  Patient/guardian was informed of the test procedure with verbalized understanding of the risk of anaphylaxis. Consent was signed.   Last antihistamine use: *** Last beta-blocker use: ***  Medication List:  Current Outpatient Medications  Medication Sig Dispense Refill   Acetaminophen (TYLENOL PO) Take by mouth.     amoxicillin (AMOXIL) 250 MG/5ML suspension Do NOT take at home. Bring to Dr. Elmyra Ricks office for drug challenge. 80 mL 0   Ibuprofen (MOTRIN PO) Take by mouth.     norgestimate-ethinyl estradiol (ORTHO-CYCLEN) 0.25-35 MG-MCG tablet Take 1 tablet by mouth daily.     rizatriptan (MAXALT) 10 MG tablet Take 1 tablet (10 mg total) by mouth as needed for migraine. May repeat in 2 hours if needed 30 tablet 3   No current facility-administered medications for this visit.   Allergies: Allergies  Allergen Reactions   Penicillins Rash   I reviewed her past medical history, social history, family history, and environmental history and no significant changes have been  reported from  her previous visit.  Review of Systems  Constitutional:  Negative for appetite change, chills, fever and unexpected weight change.  HENT:  Negative for congestion and rhinorrhea.   Eyes:  Negative for itching.  Respiratory:  Negative for cough, chest tightness, shortness of breath and wheezing.   Cardiovascular:  Negative for chest pain.  Gastrointestinal:  Negative for abdominal pain.  Genitourinary:  Negative for difficulty urinating.  Skin:  Positive for rash.  Neurological:  Negative for headaches.   Objective: There were no vitals taken for this visit. There is no height or weight on file to calculate BMI. Physical Exam Vitals and nursing note reviewed.  Constitutional:      Appearance: Normal appearance. She is well-developed.  HENT:     Head: Normocephalic and atraumatic.     Right Ear: Tympanic membrane and external ear normal.     Left Ear: Tympanic membrane and external ear normal.     Nose: Nose normal.     Mouth/Throat:     Mouth: Mucous membranes are moist.     Pharynx: Oropharynx is clear.  Eyes:     Conjunctiva/sclera: Conjunctivae normal.  Cardiovascular:     Rate and Rhythm: Normal rate and regular rhythm.     Heart sounds: Normal heart sounds. No murmur heard.    No friction rub. No gallop.  Pulmonary:     Effort: Pulmonary effort is normal.     Breath sounds: Normal breath sounds. No wheezing, rhonchi or rales.  Musculoskeletal:     Cervical back: Neck supple.  Skin:    General: Skin is warm.     Findings: No rash.     Comments: Erythematous hue on the face.  Neurological:     Mental Status: She is alert and oriented to person, place, and time.  Psychiatric:        Behavior: Behavior normal.     Diagnostics: Spirometry:  Tracings reviewed. Her effort: {Blank single:19197::"Good reproducible efforts.","It was hard to get consistent efforts and there is a question as to whether this reflects a maximal maneuver.","Poor effort, data can  not be interpreted."} FVC: ***L FEV1: ***L, ***% predicted FEV1/FVC ratio: ***% Interpretation: {Blank single:19197::"Spirometry consistent with mild obstructive disease","Spirometry consistent with moderate obstructive disease","Spirometry consistent with severe obstructive disease","Spirometry consistent with possible restrictive disease","Spirometry consistent with mixed obstructive and restrictive disease","Spirometry uninterpretable due to technique","Spirometry consistent with normal pattern","No overt abnormalities noted given today's efforts"}.  Please see scanned spirometry results for details.  Skin Testing: {Blank single:19197::"None","Deferred due to recent antihistamines use"}. Positive test to: ***. Negative test to: ***.  Results discussed with patient/family.   Previous notes and tests were reviewed. The plan was reviewed with the patient/family, and all questions/concerned were addressed.  It was my pleasure to see Anny today and participate in her care. Please feel free to contact me with any questions or concerns.  Sincerely,  Wyline Mood, DO Allergy & Immunology  [facility] office: [phone] [facility] office: [phone]

## 2022-08-04 ENCOUNTER — Encounter: Payer: Managed Care, Other (non HMO) | Admitting: Allergy

## 2022-08-04 DIAGNOSIS — Z888 Allergy status to other drugs, medicaments and biological substances status: Secondary | ICD-10-CM

## 2022-08-04 DIAGNOSIS — Z880 Allergy status to penicillin: Secondary | ICD-10-CM

## 2022-08-15 NOTE — Progress Notes (Deleted)
Follow Up Note  RE: Rachel Pennington MRN: [id number] DOB: 08-31-2000 Date of Office Visit: 08/16/2022  Referring provider: Ardith Dark, MD Primary care provider: Ardith Dark, MD  Chief Complaint: No chief complaint on file.  Assessment and Plan: Rachel Pennington is a 22 y.o. female with: No problem-specific Assessment & Plan notes found for this encounter.  No follow-ups on file.  Plan: Challenge drug: *** Challenge as per protocol: {Blank single:19197::"Passed","Failed"} Total time: ***  For next 24 hours monitor for hives, swelling, shortness of breath and dizziness. If you see these symptoms, use Benadryl for mild symptoms and for more severe symptoms go to ER/urgent care or call 911.  If no adverse symptoms in the next 24 hours then will take off drug from allergy list.  History of Present Illness: I had the pleasure of seeing Rachel Pennington for a follow up visit at the [facility] on 08/15/2022. She is a 22 y.o. female, who is being followed for adverse food reaction, penicillin allergy, rash. Her previous allergy office visit was on 07/05/2022 with Dr. Selena Batten. Today she is here for penicillin drug testing and challenge.   History of Reaction: Other adverse food reactions, not elsewhere classified, subsequent encounter Abdominal pain with avocado ingestion that resolves after a few hours without intervention. No other symptoms. No issues with latex, kiwi or bananas. Tolerates a varied food otherwise. Today's skin testing was negative to avocado. Discussed with patient that skin prick testing and bloodwork (food IgE levels) check for IgE mediated reactions which her clinical presentation does not support.  Continue to avoid avocado - you most likely have some intolerance to it.    Penicillin allergy Rash and itching as a child. Unsure of treatment. Tolerates other antibiotics with no issues. Consider penicillin allergy skin testing and in office drug  challenge in the future.  Over 90% of people with history of penicillin allergy which occurred over 10 years ago are found to be non-allergic.    Rash and other nonspecific skin eruption See below for proper skin care. Follow up with dermatologist as scheduled.  Interval History: Patient has not been ill, she has not had any accidental exposures to the culprit medication.   Recent/Current History: Pulmonary disease: {Blank single:19197::"yes","no"} Cardiac disease: {Blank single:19197::"yes","no"} Respiratory infection: {Blank single:19197::"yes","no"} Rash: {Blank single:19197::"yes","no"} Itch: {Blank single:19197::"yes","no"} Swelling: {Blank single:19197::"yes","no"} Cough: {Blank single:19197::"yes","no"} Shortness of breath: {Blank single:19197::"yes","no"} Runny/stuffy nose: {Blank single:19197::"yes","no"} Itchy eyes: {Blank single:19197::"yes","no"} Beta-blocker use: {Blank single:19197::"yes","no"}  Patient/guardian was informed of the test procedure with verbalized understanding of the risk of anaphylaxis. Consent was signed.   Last antihistamine use: *** Last beta-blocker use: ***  Medication List:  Current Outpatient Medications  Medication Sig Dispense Refill   Acetaminophen (TYLENOL PO) Take by mouth.     amoxicillin (AMOXIL) 250 MG/5ML suspension Do NOT take at home. Bring to Dr. Elmyra Ricks office for drug challenge. 80 mL 0   Ibuprofen (MOTRIN PO) Take by mouth.     norgestimate-ethinyl estradiol (ORTHO-CYCLEN) 0.25-35 MG-MCG tablet Take 1 tablet by mouth daily.     rizatriptan (MAXALT) 10 MG tablet Take 1 tablet (10 mg total) by mouth as needed for migraine. May repeat in 2 hours if needed 30 tablet 3   No current facility-administered medications for this visit.   Allergies: Allergies  Allergen Reactions   Penicillins Rash   I reviewed her past medical history, social history, family history, and environmental history and no significant changes have been  reported from her  previous visit.  Review of Systems  Constitutional:  Negative for appetite change, chills, fever and unexpected weight change.  HENT:  Negative for congestion and rhinorrhea.   Eyes:  Negative for itching.  Respiratory:  Negative for cough, chest tightness, shortness of breath and wheezing.   Cardiovascular:  Negative for chest pain.  Gastrointestinal:  Negative for abdominal pain.  Genitourinary:  Negative for difficulty urinating.  Skin:  Positive for rash.  Neurological:  Negative for headaches.   Objective: There were no vitals taken for this visit. There is no height or weight on file to calculate BMI. Physical Exam Vitals and nursing note reviewed.  Constitutional:      Appearance: Normal appearance. She is well-developed.  HENT:     Head: Normocephalic and atraumatic.     Right Ear: Tympanic membrane and external ear normal.     Left Ear: Tympanic membrane and external ear normal.     Nose: Nose normal.     Mouth/Throat:     Mouth: Mucous membranes are moist.     Pharynx: Oropharynx is clear.  Eyes:     Conjunctiva/sclera: Conjunctivae normal.  Cardiovascular:     Rate and Rhythm: Normal rate and regular rhythm.     Heart sounds: Normal heart sounds. No murmur heard.    No friction rub. No gallop.  Pulmonary:     Effort: Pulmonary effort is normal.     Breath sounds: Normal breath sounds. No wheezing, rhonchi or rales.  Musculoskeletal:     Cervical back: Neck supple.  Skin:    General: Skin is warm.     Findings: No rash.     Comments: Erythematous hue on the face.  Neurological:     Mental Status: She is alert and oriented to person, place, and time.  Psychiatric:        Behavior: Behavior normal.     Diagnostics: Skin Testing: {Blank single:19197::"None","Deferred due to recent antihistamines use"}. Positive test to: ***. Negative test to: ***.  Results discussed with patient/family.   Previous notes and tests were reviewed. The  plan was reviewed with the patient/family, and all questions/concerned were addressed.  It was my pleasure to see Rachel Pennington today and participate in her care. Please feel free to contact me with any questions or concerns.  Sincerely,  Wyline Mood, DO Allergy & Immunology  [facility] office: [phone] [facility] office: [phone]

## 2022-08-16 ENCOUNTER — Encounter: Payer: Self-pay | Admitting: Allergy

## 2022-12-16 NOTE — Progress Notes (Unsigned)
NEUROLOGY CONSULTATION NOTE  Rachel Pennington MRN: [id number] DOB: Dec 16, 2000  Referring provider: Jacquiline Doe, MD Primary care provider: Jacquiline Doe, MD  Reason for consult:  migraine  Assessment/Plan:   ***   Subjective:  Rachel Pennington is a 22 year old ***-handed female who presents for migraines.  History supplemented by prior neurologist's and referring provider's notes.  MRI of brain personally reviewed.  Onset:  22 years old Location:  *** Quality:  *** Intensity:  ***.  *** denies new headache, thunderclap headache or severe headache that wakes *** from sleep. Aura:  *** Prodrome:  *** Postdrome:  *** Associated symptoms:  ***.  *** denies associated unilateral numbness or weakness. Duration:  *** Frequency:  *** Frequency of abortive medication: *** Triggers:  *** Relieving factors:  *** Activity:  ***  06/17/2022 MRI BRAIN WO:  1. Focus of cortical thickening and T2 FLAIR hyperintense signal abnormality within the right occipital lobe measuring 2.1 cm. Primary differential considerations are primary CNS neoplasm (a glioma would be favored) versus cortical dysplasia. Post-contrast MR imaging of the brain is recommended for further characterization. 2. Several small foci of T2 FLAIR hyperintense signal abnormality scattered within the bilateral cerebral white matter. These signal changes are nonspecific and differential considerations include sequelae of chronic migraine headaches, age advanced chronic small vessel ischemic disease, hypercoagulable state, sequelae of a prior infectious/inflammatory process and sequelae of demyelinating disease, among others.  Past NSAIDS/analgesics:  *** Past abortive triptans:  *** Past abortive ergotamine:  *** Past muscle relaxants:  *** Past anti-emetic:  *** Past antihypertensive medications:  *** Past antidepressant medications:  *** Past anticonvulsant medications:  *** Past anti-CGRP:  *** Past  vitamins/Herbal/Supplements:  magnesium oxide 400-600mg  daily Past antihistamines/decongestants:  *** Other past therapies:  ***  Current NSAIDS/analgesics:  ibuprofen, acetaminophen Current triptans:  rizatriptan 10mg  Current ergotamine:  none Current anti-emetic:  none Current muscle relaxants:  none Current Antihypertensive medications:  none Current Antidepressant medications:  none Current Anticonvulsant medications:  none Current anti-CGRP:  none Current Vitamins/Herbal/Supplements:  none Current Antihistamines/Decongestants:  none Other therapy:  none Birth control:  Ortho-Cyclen   Caffeine:  *** Alcohol:  *** Smoker:  *** Diet:  *** Exercise:  *** Depression:  ***; Anxiety:  *** Other pain:  *** Sleep hygiene:  *** Family history of headache:  ***      PAST MEDICAL HISTORY: No past medical history on file.  PAST SURGICAL HISTORY: No past surgical history on file.  MEDICATIONS: Current Outpatient Medications on File Prior to Visit  Medication Sig Dispense Refill   Acetaminophen (TYLENOL PO) Take by mouth.     amoxicillin (AMOXIL) 250 MG/5ML suspension Do NOT take at home. Bring to Dr. Elmyra Ricks office for drug challenge. 80 mL 0   Ibuprofen (MOTRIN PO) Take by mouth.     norgestimate-ethinyl estradiol (ORTHO-CYCLEN) 0.25-35 MG-MCG tablet Take 1 tablet by mouth daily.     rizatriptan (MAXALT) 10 MG tablet Take 1 tablet (10 mg total) by mouth as needed for migraine. May repeat in 2 hours if needed 30 tablet 3   No current facility-administered medications on file prior to visit.    ALLERGIES: Allergies  Allergen Reactions   Penicillins Rash    FAMILY HISTORY: No family history on file.  Objective:  *** General: No acute distress.  Patient appears well-groomed.   Head:  Normocephalic/atraumatic Eyes:  fundi examined but not visualized Neck: supple, no paraspinal tenderness, full range of motion Back: No paraspinal tenderness Heart: regular rate and  rhythm Lungs: Clear to auscultation bilaterally. Vascular: No carotid bruits. Neurological Exam: Mental status: alert and oriented to person, place, and time, speech fluent and not dysarthric, language intact. Cranial nerves: CN I: not tested CN II: pupils equal, round and reactive to light, visual fields intact CN III, IV, VI:  full range of motion, no nystagmus, no ptosis CN V: facial sensation intact. CN VII: upper and lower face symmetric CN VIII: hearing intact CN IX, X: gag intact, uvula midline CN XI: sternocleidomastoid and trapezius muscles intact CN XII: tongue midline Bulk & Tone: normal, no fasciculations. Motor:  muscle strength 5/5 throughout Sensation:  Pinprick, temperature and vibratory sensation intact. Deep Tendon Reflexes:  2+ throughout,  toes downgoing.   Finger to nose testing:  Without dysmetria.   Heel to shin:  Without dysmetria.   Gait:  Normal station and stride.  Romberg negative.    Thank you for allowing me to take part in the care of this patient.  Shon Millet, DO  CC: ***

## 2022-12-19 ENCOUNTER — Ambulatory Visit: Payer: Self-pay | Admitting: Neurology

## 2023-01-23 ENCOUNTER — Encounter: Payer: Self-pay | Admitting: Family Medicine

## 2023-01-24 ENCOUNTER — Ambulatory Visit (INDEPENDENT_AMBULATORY_CARE_PROVIDER_SITE_OTHER): Payer: PPO | Admitting: Family Medicine

## 2023-01-24 VITALS — BP 119/53 | HR 62 | Temp 97.3°F | Ht 68.0 in | Wt 149.6 lb

## 2023-01-24 DIAGNOSIS — G43809 Other migraine, not intractable, without status migrainosus: Secondary | ICD-10-CM | POA: Diagnosis not present

## 2023-01-24 DIAGNOSIS — Z131 Encounter for screening for diabetes mellitus: Secondary | ICD-10-CM | POA: Diagnosis not present

## 2023-01-24 DIAGNOSIS — Z0001 Encounter for general adult medical examination with abnormal findings: Secondary | ICD-10-CM

## 2023-01-24 DIAGNOSIS — E785 Hyperlipidemia, unspecified: Secondary | ICD-10-CM | POA: Insufficient documentation

## 2023-01-24 LAB — LIPID PANEL
Cholesterol: 230 mg/dL — ABNORMAL HIGH (ref 0–200)
HDL: 96.4 mg/dL (ref >39.00)
LDL Cholesterol: 120 mg/dL — ABNORMAL HIGH (ref 0–99)
NonHDL: 133.33
Total CHOL/HDL Ratio: 2
Triglycerides: 68 mg/dL (ref 0.0–149.0)
VLDL: 13.6 mg/dL (ref 0.0–40.0)

## 2023-01-24 LAB — COMPREHENSIVE METABOLIC PANEL
ALT: 13 U/L (ref 0–35)
AST: 18 U/L (ref 0–37)
Albumin: 4.6 g/dL (ref 3.5–5.2)
Alkaline Phosphatase: 42 U/L (ref 39–117)
BUN: 14 mg/dL (ref 6–23)
CO2: 25 meq/L (ref 19–32)
Calcium: 9.4 mg/dL (ref 8.4–10.5)
Chloride: 102 meq/L (ref 96–112)
Creatinine, Ser: 0.63 mg/dL (ref 0.40–1.20)
GFR: 124.5 mL/min (ref >60.00)
Glucose, Bld: 88 mg/dL (ref 70–99)
Potassium: 3.8 meq/L (ref 3.5–5.1)
Sodium: 137 meq/L (ref 135–145)
Total Bilirubin: 0.4 mg/dL (ref 0.2–1.2)
Total Protein: 7.7 g/dL (ref 6.0–8.3)

## 2023-01-24 LAB — CBC
HCT: 41 % (ref 36.0–46.0)
Hemoglobin: 13.6 g/dL (ref 12.0–15.0)
MCHC: 33.1 g/dL (ref 30.0–36.0)
MCV: 89.1 fL (ref 78.0–100.0)
Platelets: 194 10*3/uL (ref 150.0–400.0)
RBC: 4.61 Mil/uL (ref 3.87–5.11)
RDW: 13.3 % (ref 11.5–15.5)
WBC: 4 10*3/uL (ref 4.0–10.5)

## 2023-01-24 LAB — HEMOGLOBIN A1C: Hgb A1c MFr Bld: 5.9 % (ref 4.6–6.5)

## 2023-01-24 LAB — TSH: TSH: 1.21 u[IU]/mL (ref 0.35–5.50)

## 2023-01-24 NOTE — Patient Instructions (Signed)
It was very nice to see you today!  We will check blood work.  Please continue to work on diet and exercise.  I will see you back in a year.  Come back sooner if needed.  Return in about 1 year (around 01/24/2024) for Annual Physical.   Take care, Dr Jimmey Ralph  PLEASE NOTE:  If you had any lab tests, please let us know if you have not heard back within a few days. You may see your results on mychart before we have a chance to review them but we will give you a call once they are reviewed by [place].   If we ordered any referrals today, please let us know if you have not heard from their office within the next week.   If you had any urgent prescriptions sent in today, please check with the pharmacy within an hour of our visit to make sure the prescription was transmitted appropriately.   Please try these tips to maintain a healthy lifestyle:  Eat at least 3 REAL meals and 1-2 snacks per day.  Aim for no more than 5 hours between eating.  If you eat breakfast, please do so within one hour of getting up.   Each meal should contain half fruits/vegetables, one quarter protein, and one quarter carbs (no bigger than a computer mouse)  Cut down on sweet beverages. This includes juice, soda, and sweet tea.   Drink at least 1 glass of water with each meal and aim for at least 8 glasses per day  Exercise at least 150 minutes every week.    Preventive Care 36-51 Years Old, Female Preventive care refers to lifestyle choices and visits with your health care provider that can promote health and wellness. Preventive care visits are also called wellness exams. What can I expect for my preventive care visit? Counseling During your preventive care visit, your health care provider may ask about your: Medical history, including: Past medical problems. Family medical history. Pregnancy history. Current health, including: Menstrual cycle. Method of birth control. Emotional well-being. Home life and  relationship well-being. Sexual activity and sexual health. Lifestyle, including: Alcohol, nicotine or tobacco, and drug use. Access to firearms. Diet, exercise, and sleep habits. Work and work Astronomer. Sunscreen use. Safety issues such as seatbelt and bike helmet use. Physical exam Your health care provider may check your: Height and weight. These may be used to calculate your BMI (body mass index). BMI is a measurement that tells if you are at a healthy weight. Waist circumference. This measures the distance around your waistline. This measurement also tells if you are at a healthy weight and may help predict your risk of certain diseases, such as type 2 diabetes and high blood pressure. Heart rate and blood pressure. Body temperature. Skin for abnormal spots. What immunizations do I need?  Vaccines are usually given at various ages, according to a schedule. Your health care provider will recommend vaccines for you based on your age, medical history, and lifestyle or other factors, such as travel or where you work. What tests do I need? Screening Your health care provider may recommend screening tests for certain conditions. This may include: Pelvic exam and Pap test. Lipid and cholesterol levels. Diabetes screening. This is done by checking your blood sugar (glucose) after you have not eaten for a while (fasting). Hepatitis B test. Hepatitis C test. HIV (human immunodeficiency virus) test. STI (sexually transmitted infection) testing, if you are at risk. BRCA-related cancer screening. This may be  done if you have a family history of breast, ovarian, tubal, or peritoneal cancers. Talk with your health care provider about your test results, treatment options, and if necessary, the need for more tests. Follow these instructions at home: Eating and drinking  Eat a healthy diet that includes fresh fruits and vegetables, whole grains, lean protein, and low-fat dairy products. Take  vitamin and mineral supplements as recommended by your health care provider. Do not drink alcohol if: Your health care provider tells you not to drink. You are pregnant, may be pregnant, or are planning to become pregnant. If you drink alcohol: Limit how much you have to 0-1 drink a day. Know how much alcohol is in your drink. In the [place] one drink equals one 12 oz bottle of beer (355 mL), one 5 oz glass of wine (148 mL), or one 1 oz glass of hard liquor (44 mL). Lifestyle Brush your teeth every morning and night with fluoride toothpaste. Floss one time each day. Exercise for at least 30 minutes 5 or more days each week. Do not use any products that contain nicotine or tobacco. These products include cigarettes, chewing tobacco, and vaping devices, such as e-cigarettes. If you need help quitting, ask your health care provider. Do not use drugs. If you are sexually active, practice safe sex. Use a condom or other form of protection to prevent STIs. If you do not wish to become pregnant, use a form of birth control. If you plan to become pregnant, see your health care provider for a prepregnancy visit. Find healthy ways to manage stress, such as: Meditation, yoga, or listening to music. Journaling. Talking to a trusted person. Spending time with friends and family. Minimize exposure to UV radiation to reduce your risk of skin cancer. Safety Always wear your seat belt while driving or riding in a vehicle. Do not drive: If you have been drinking alcohol. Do not ride with someone who has been drinking. If you have been using any mind-altering substances or drugs. While texting. When you are tired or distracted. Wear a helmet and other protective equipment during sports activities. If you have firearms in your house, make sure you follow all gun safety procedures. Seek help if you have been physically or sexually abused. What's next? Go to your health care provider once a year for an  annual wellness visit. Ask your health care provider how often you should have your eyes and teeth checked. Stay up to date on all vaccines. This information is not intended to replace advice given to you by your health care provider. Make sure you discuss any questions you have with your health care provider. Document Revised: 06/17/2020 Document Reviewed: 06/17/2020 Elsevier Patient Education  2024 [employer].

## 2023-01-24 NOTE — Progress Notes (Signed)
Chief Complaint:  Rachel Pennington is a 23 y.o. female who presents today for her annual comprehensive physical exam.    Assessment/Plan:  Chronic Problems Addressed Today: Migraine Stable.  Continue management per neurology.  Dyslipidemia Check lipids.  Discussed lifestyle modifications.  Preventative Healthcare: Check labs. Due for pap - follows with GYN for this.   Patient Counseling(The following topics were reviewed and/or handout was given):  -Nutrition: Stressed importance of moderation in sodium/caffeine intake, saturated fat and cholesterol, caloric balance, sufficient intake of fresh fruits, vegetables, and fiber.  -Stressed the importance of regular exercise.   -Substance Abuse: Discussed cessation/primary prevention of tobacco, alcohol, or other drug use; driving or other dangerous activities under the influence; availability of treatment for abuse.   -Injury prevention: Discussed safety belts, safety helmets, smoke detector, smoking near bedding or upholstery.   -Sexuality: Discussed sexually transmitted diseases, partner selection, use of condoms, avoidance of unintended pregnancy and contraceptive alternatives.   -Dental health: Discussed importance of regular tooth brushing, flossing, and dental visits.  -Health maintenance and immunizations reviewed. Please refer to Health maintenance section.  Return to care in 1 year for next preventative visit.     Subjective:  HPI:  She has no acute complaints today. See Assessment / plan for status of chronic conditions.   Lifestyle Diet: Balanced. Plenty of fruits and vegetables.  Exercise: Very active with school - in ultimate frisbee, taekwando, and swimming.      01/24/2023   11:23 AM  Depression screen PHQ 2/9  Decreased Interest 0  Down, Depressed, Hopeless 0  PHQ - 2 Score 0    Health Maintenance Due  Topic Date Due   Cervical Cancer Screening (Pap smear)  Never done     ROS: Per HPI, otherwise a  complete review of systems was negative.   PMH:  The following were reviewed and entered/updated in epic: No past medical history on file. Patient Active Problem List   Diagnosis Date Noted   Dyslipidemia 01/24/2023   Dietary counseling and surveillance 07/06/2022   Penicillin allergy 07/06/2022   Rash and other nonspecific skin eruption 07/06/2022   Other adverse food reactions, not elsewhere classified, subsequent encounter 07/06/2022   Migraine 06/07/2022   No past surgical history on file.  No family history on file.  Medications- reviewed and updated Current Outpatient Medications  Medication Sig Dispense Refill   Acetaminophen (TYLENOL PO) Take by mouth.     Ibuprofen (MOTRIN PO) Take by mouth.     norgestimate-ethinyl estradiol (ORTHO-CYCLEN) 0.25-35 MG-MCG tablet Take 1 tablet by mouth daily.     rizatriptan (MAXALT) 10 MG tablet Take 1 tablet (10 mg total) by mouth as needed for migraine. May repeat in 2 hours if needed 30 tablet 3   No current facility-administered medications for this visit.    Allergies-reviewed and updated Allergies  Allergen Reactions   Penicillins Rash    Social History   Socioeconomic History   Marital status: Single    Spouse name: Not on file   Number of children: Not on file   Years of education: Not on file   Highest education level: Not on file  Occupational History   Not on file  Tobacco Use   Smoking status: Never    Passive exposure: Never   Smokeless tobacco: Never  Substance and Sexual Activity   Alcohol use: Yes    Comment: occ   Drug use: Never   Sexual activity: Not on file  Other Topics Concern  Not on file  Social History Narrative   Not on file   Social Drivers of Health   Financial Resource Strain: Not on file  Food Insecurity: Unknown (01/20/2022)   Received from [employer] and [employer], [employer] and [employer]    Worried about running out of food:  Not on [employer] Bought: Not on file  Transportation Needs: Unknown (01/20/2022)   Received from [facility] and [employer], [employer] and [employer]    Worried about transportation: Not on file  Physical Activity: Not on file  Stress: Not on file  Social Connections: Not on file        Objective:  Physical Exam: BP (!) 119/53   Pulse 62   Temp (!) 97.3 F (36.3 C) (Temporal)   Ht 5\' 8"  (1.727 m)   Wt 149 lb 9.6 oz (67.9 kg)   LMP 01/18/2023   SpO2 98%   BMI 22.75 kg/m   Body mass index is 22.75 kg/m. Wt Readings from Last 3 Encounters:  01/24/23 149 lb 9.6 oz (67.9 kg)  07/05/22 150 lb 8 oz (68.3 kg)  06/07/22 150 lb (68 kg)   Gen: NAD, resting comfortably HEENT: TMs normal bilaterally. OP clear. No thyromegaly noted.  CV: RRR with no murmurs appreciated Pulm: NWOB, CTAB with no crackles, wheezes, or rhonchi GI: Normal bowel sounds present. Soft, Nontender, Nondistended. MSK: no edema, cyanosis, or clubbing noted Skin: warm, dry Neuro: CN2-12 grossly intact. Strength 5/5 in upper and lower extremities. Reflexes symmetric and intact bilaterally.  Psych: Normal affect and thought content     Dechelle Attaway M. Jimmey Ralph, MD 01/24/2023 11:57 AM

## 2023-01-24 NOTE — Assessment & Plan Note (Signed)
Check lipids. Discussed lifestyle modifications.  

## 2023-01-24 NOTE — Assessment & Plan Note (Signed)
Stable.  Continue management per neurology. 

## 2023-01-26 ENCOUNTER — Encounter: Payer: Self-pay | Admitting: Family Medicine

## 2023-01-26 NOTE — Progress Notes (Signed)
Cholesterol and blood are both borderline but stable.  Do not need to start meds.  She should continue to work on diet and exercise and we can recheck in a year or so.

## 2023-01-29 ENCOUNTER — Encounter: Payer: Self-pay | Admitting: Family Medicine

## 2023-01-30 NOTE — Telephone Encounter (Signed)
Please advise

## 2023-01-31 NOTE — Telephone Encounter (Signed)
I do not think that will cause her any issues. If she is having pain then recommend she let us know and we can refer to physical therapy  Ariyanna Oien M. Jimmey Ralph, MD 01/31/2023 12:58 PM

## 2023-05-18 ENCOUNTER — Ambulatory Visit: Payer: Self-pay | Admitting: Neurology

## 2023-06-06 NOTE — Progress Notes (Deleted)
 NEUROLOGY CONSULTATION NOTE  Rachel Pennington MRN: [id number] DOB: 2000-10-27  Referring provider: Valdene Garret, MD Primary care provider: Valdene Garret, MD  Reason for consult:  migraines  Assessment/Plan:   ***   Subjective:  Rachel Pennington is a 23 year old ***-handed female who presents for migraines.  History supplemented by prior neurologist's and referring provider's notes.  MRI brain personally reviewed.  Onset:  approximately 23 years old Location:  *** Quality:  sharp, throbbing Intensity:  ***.  *** denies new headache, thunderclap headache or severe headache that wakes *** from sleep. Aura:  *** Prodrome:  *** Postdrome:  *** Associated symptoms:  photophobia, osmophobia, hyperesthesia, decreased appetite, emotionally drained.  *** denies associated unilateral numbness or weakness. Duration:  *** Frequency:  initially 1 to 2 days per month, *** Frequency of abortive medication: *** Triggers:  stress, loud noises, bright lights, sleep deprivation, dehydration Relieving factors:  *** Activity:  ***  Due to increased frequency, she had an MRI of brain without contrast on 06/17/2022 showed several nonspecific cerebral white matter changes as well as T2 FLAIR hyperintense focus within the right occipital lobe: 1. Focus of cortical thickening and T2 FLAIR hyperintense signal abnormality within the right occipital lobe measuring 2.1 cm. Primary differential considerations are primary CNS neoplasm (a glioma would be favored) versus cortical dysplasia. Post-contrast MR imaging of the brain is recommended for further characterization. 2. Several small foci of T2 FLAIR hyperintense signal abnormality scattered within the bilateral cerebral white matter. These signal changes are nonspecific and differential considerations include sequelae of chronic migraine headaches, age advanced chronic small vessel ischemic disease, hypercoagulable state, sequelae of a prior  infectious/inflammatory process and sequelae of demyelinating disease, among others.  Past NSAIDS/analgesics:  *** Past abortive triptans:  *** Past abortive ergotamine:  none Past muscle relaxants:  none Past anti-emetic:  none Past antihypertensive medications:  none Past antidepressant medications:  none Past anticonvulsant medications:  none Past anti-CGRP:  none Past vitamins/Herbal/Supplements:  magnesium oxide 400-600mg  daily Past antihistamines/decongestants:  none Other past therapies:  ***  Current NSAIDS/analgesics:  *** Current triptans:  rizatriptan  10mg  Current ergotamine:  none Current anti-emetic:  none Current muscle relaxants:  none Current Antihypertensive medications:  none Current Antidepressant medications:  none Current Anticonvulsant medications:  none Current anti-CGRP:  none Current Vitamins/Herbal/Supplements:  none Current Antihistamines/Decongestants:  none Other therapy:  *** Birth control:  Ortho-Cyclen   Caffeine:  *** Alcohol:  *** Smoker:  *** Diet:  *** Exercise:  *** Depression:  ***; Anxiety:  *** Other pain:  *** Sleep hygiene:  *** Family history of headache:  ***      PAST MEDICAL HISTORY: No past medical history on file.  PAST SURGICAL HISTORY: No past surgical history on file.  MEDICATIONS: Current Outpatient Medications on File Prior to Visit  Medication Sig Dispense Refill   Acetaminophen (TYLENOL PO) Take by mouth.     Ibuprofen (MOTRIN PO) Take by mouth.     norgestimate-ethinyl estradiol (ORTHO-CYCLEN) 0.25-35 MG-MCG tablet Take 1 tablet by mouth daily.     rizatriptan  (MAXALT ) 10 MG tablet Take 1 tablet (10 mg total) by mouth as needed for migraine. May repeat in 2 hours if needed 30 tablet 3   No current facility-administered medications on file prior to visit.    ALLERGIES: Allergies  Allergen Reactions   Penicillins Rash    FAMILY HISTORY: No family history on file.  Objective:  *** General: No  acute distress.  Patient appears well-groomed.   Head:  Normocephalic/atraumatic Eyes:  fundi examined but not visualized Neck: supple, no paraspinal tenderness, full range of motion Heart: regular rate and rhythm Vascular: No carotid bruits. Neurological Exam: Mental status: alert and oriented to person, place, and time, speech fluent and not dysarthric, language intact. Cranial nerves: CN I: not tested CN II: pupils equal, round and reactive to light, visual fields intact CN III, IV, VI:  full range of motion, no nystagmus, no ptosis CN V: facial sensation intact. CN VII: upper and lower face symmetric CN VIII: hearing intact CN IX, X: gag intact, uvula midline CN XI: sternocleidomastoid and trapezius muscles intact CN XII: tongue midline Bulk & Tone: normal, no fasciculations. Motor:  muscle strength 5/5 throughout Sensation:  Pinprick and vibratory sensation intact. Deep Tendon Reflexes:  2+ throughout,  toes downgoing.   Finger to nose testing:  Without dysmetria.   Gait:  Normal station and stride.  Romberg negative.    Thank you for allowing me to take part in the care of this patient.  Janne Members, DO  CC: Valdene Garret, MD

## 2023-06-07 ENCOUNTER — Ambulatory Visit: Payer: Self-pay | Admitting: Neurology
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
## 2023-12-21 ENCOUNTER — Encounter: Payer: Self-pay | Admitting: Neurology

## 2023-12-21 ENCOUNTER — Ambulatory Visit: Admitting: Neurology

## 2023-12-21 VITALS — BP 117/80 | HR 99 | Ht 68.0 in | Wt 151.2 lb

## 2023-12-21 DIAGNOSIS — G43009 Migraine without aura, not intractable, without status migrainosus: Secondary | ICD-10-CM

## 2023-12-21 MED ORDER — TOPIRAMATE 25 MG PO TABS: 25.0000 mg | ORAL_TABLET | Freq: Every day | ORAL | 5 refills | Status: AC

## 2023-12-21 NOTE — Patient Instructions (Signed)
°  Start TOPIRAMATE  25MG  AT BEDTIME.  Contact us  in 4 weeks with update and we can increase dose if needed. Take RIZATRIPTAN  at earliest onset of headache.  May repeat dose once in 2 hours if needed.  Maximum 2 tablets in 24 hours. Limit use of pain relievers to no more than 9 days out of the month.  These medications include acetaminophen, NSAIDs (ibuprofen/Advil/Motrin, naproxen/Aleve, triptans (Imitrex/sumatriptan), Excedrin, and narcotics.  This will help reduce risk of rebound headaches. Be aware of common food triggers Routine exercise Stay adequately hydrated (aim for 64 oz water daily) Keep headache diary Maintain proper stress management Maintain proper sleep hygiene Do not skip meals Consider supplements:  magnesium citrate 400mg  daily, riboflavin 400mg  daily, coenzyme Q10 100mg  three times daily.

## 2024-07-08 ENCOUNTER — Ambulatory Visit: Payer: Self-pay | Admitting: Neurology
